# Patient Record
Sex: Male | Born: 1985 | Race: Black or African American | Hispanic: No | Marital: Single | Smoking: Current some day smoker
Health system: Southern US, Community
[De-identification: ages and names within clinical notes are randomized; demographics above are authoritative.]

## PROBLEM LIST (undated history)

## (undated) DIAGNOSIS — Z9229 Personal history of other drug therapy: Secondary | ICD-10-CM

## (undated) HISTORY — PX: HERNIA REPAIR: SHX51

---

## 2007-08-15 ENCOUNTER — Emergency Department (HOSPITAL_COMMUNITY): Admission: EM | Admit: 2007-08-15 | Discharge: 2007-08-15 | Payer: Self-pay | Admitting: Emergency Medicine

## 2007-12-28 ENCOUNTER — Emergency Department (HOSPITAL_COMMUNITY): Admission: EM | Admit: 2007-12-28 | Discharge: 2007-12-28 | Payer: Self-pay | Admitting: Emergency Medicine

## 2008-04-18 ENCOUNTER — Emergency Department (HOSPITAL_COMMUNITY): Admission: EM | Admit: 2008-04-18 | Discharge: 2008-04-19 | Payer: Self-pay | Admitting: Emergency Medicine

## 2011-08-01 LAB — DIFFERENTIAL
Basophils Relative: 1
Lymphocytes Relative: 48 — ABNORMAL HIGH
Monocytes Relative: 9
Neutro Abs: 2.1
Neutrophils Relative %: 39 — ABNORMAL LOW

## 2011-08-01 LAB — CBC
MCHC: 34.4
RBC: 4.86
WBC: 5.4

## 2011-08-01 LAB — POCT CARDIAC MARKERS
CKMB, poc: <1 — ABNORMAL LOW
Myoglobin, poc: 43
Troponin i, poc: <0.05

## 2011-08-01 LAB — B-NATRIURETIC PEPTIDE (CONVERTED LAB): Pro B Natriuretic peptide (BNP): <30

## 2011-08-07 LAB — DIFFERENTIAL
Eosinophils Absolute: 0.3
Eosinophils Relative: 3
Lymphs Abs: 3.5
Monocytes Relative: 7

## 2011-08-07 LAB — COMPREHENSIVE METABOLIC PANEL
ALT: 19
AST: 25
CO2: 25
Calcium: 9.6
GFR calc Af Amer: >60
Sodium: 140
Total Protein: 6.4

## 2011-08-07 LAB — CBC
MCHC: 34.7
RBC: 4.55
RDW: 12.7

## 2011-08-07 LAB — POCT CARDIAC MARKERS
CKMB, poc: <1 — ABNORMAL LOW
Myoglobin, poc: 46.7
Operator id: 277751
Troponin i, poc: <0.05

## 2011-11-11 ENCOUNTER — Encounter: Payer: Self-pay | Admitting: Emergency Medicine

## 2011-11-11 ENCOUNTER — Emergency Department (HOSPITAL_COMMUNITY)
Admission: EM | Admit: 2011-11-11 | Discharge: 2011-11-11 | Disposition: A | Discharge status: Home / Self Care | Payer: Self-pay | Attending: Emergency Medicine | Admitting: Emergency Medicine

## 2011-11-11 DIAGNOSIS — K2920 Alcoholic gastritis without bleeding: Secondary | ICD-10-CM | POA: Insufficient documentation

## 2011-11-11 DIAGNOSIS — K117 Disturbances of salivary secretion: Secondary | ICD-10-CM | POA: Insufficient documentation

## 2011-11-11 DIAGNOSIS — R1110 Vomiting, unspecified: Secondary | ICD-10-CM | POA: Insufficient documentation

## 2011-11-11 DIAGNOSIS — F17200 Nicotine dependence, unspecified, uncomplicated: Secondary | ICD-10-CM | POA: Insufficient documentation

## 2011-11-11 MED ORDER — ONDANSETRON HCL 8 MG PO TABS: 8.0000 mg | ORAL_TABLET | Freq: Once | ORAL | Status: DC

## 2011-11-11 MED ORDER — SODIUM CHLORIDE 0.9 % IV BOLUS (SEPSIS)
1000.0000 mL | Freq: Once | INTRAVENOUS | Status: AC
  Administered 2011-11-11: 1000 mL via INTRAVENOUS

## 2011-11-11 MED ORDER — ONDANSETRON HCL 4 MG/2ML IJ SOLN
4.0000 mg | Freq: Once | INTRAMUSCULAR | Status: AC
  Administered 2011-11-11: 4 mg via INTRAVENOUS
  Filled 2011-11-11: qty 2

## 2011-11-11 MED ORDER — ONDANSETRON 4 MG PO TBDP
ORAL_TABLET | ORAL | Status: AC
  Administered 2011-11-11: 8 mg via SUBLINGUAL
  Filled 2011-11-11: qty 2

## 2011-11-11 NOTE — ED Provider Notes (Signed)
History     CSN: [id number]  Arrival date & time 11/11/11  1615   First MD Initiated Contact with Patient 11/11/11 1830      Chief Complaint  Patient presents with  . Emesis    (Consider location/radiation/quality/duration/timing/severity/associated sxs/prior treatment) Patient is a 26 y.o. male presenting with vomiting. The history is provided by the patient.  Emesis  This is a new problem. The current episode started yesterday. The problem occurs more than 10 times per day. The problem has been gradually improving. The emesis has an appearance of stomach contents. There has been no fever. Pertinent negatives include no abdominal pain, no cough, no diarrhea, no fever and no URI. Risk factors: Heavy alcohol intake yesterday.    History reviewed. No pertinent past medical history.  History reviewed. No pertinent past surgical history.  History reviewed. No pertinent family history.  History  Substance Use Topics  . Smoking status: Current Some Day Smoker  . Smokeless tobacco: Not on file  . Alcohol Use: Yes      Review of Systems  Constitutional: Negative for fever.  Respiratory: Negative for cough.   Gastrointestinal: Positive for vomiting. Negative for abdominal pain and diarrhea.  All other systems reviewed and are negative.    Allergies  Review of patient's allergies indicates no known allergies.  Home Medications  No current outpatient prescriptions on file.  BP 133/88  Pulse 80  Temp 98.3 F (36.8 C)  Resp 18  SpO2 98%  Physical Exam  Nursing note and vitals reviewed. Constitutional: He is oriented to person, place, and time. He appears well-developed and well-nourished. No distress.  HENT:  Head: Normocephalic and atraumatic.  Mouth/Throat: Mucous membranes are dry.  Eyes: Conjunctivae and EOM are normal. Pupils are equal, round, and reactive to light.  Neck: Normal range of motion. Neck supple.  Cardiovascular: Normal rate, regular rhythm and  intact distal pulses.   No murmur heard. Pulmonary/Chest: Effort normal and breath sounds normal. No respiratory distress. He has no wheezes. He has no rales.  Abdominal: Soft. He exhibits no distension. There is no tenderness. There is no rebound and no guarding.  Musculoskeletal: Normal range of motion. He exhibits no edema and no tenderness.  Neurological: He is alert and oriented to person, place, and time.  Skin: Skin is warm and dry. No rash noted. No erythema.  Psychiatric: He has a normal mood and affect. His behavior is normal.    ED Course  Procedures (including critical care time)  Labs Reviewed - No data to display No results found.   No diagnosis found.    MDM   Patient with persistent vomiting and nausea after heavy alcohol intake yesterday. No abdominal pain normal exam and after ODT Zofran and feeling much better. Will fluid challenge.  7:39 PM Patient given sprite and started to feel nauseated again. Will give IV hydration and antibiotic      Gwyneth Sprout, MD 11/11/11 2007

## 2011-11-11 NOTE — ED Provider Notes (Signed)
I initially evaluated the pt.  The PA continued treatment and I have reviewed the note and agree with management.  Gwyneth Sprout, MD 11/11/11 2257

## 2011-11-11 NOTE — ED Provider Notes (Signed)
History     CSN: [id number]  Arrival date & time 11/11/11  1615   First MD Initiated Contact with Patient 11/11/11 1830      Chief Complaint  Patient presents with  . Emesis    (Consider location/radiation/quality/duration/timing/severity/associated sxs/prior treatment) HPI  History reviewed. No pertinent past medical history.  History reviewed. No pertinent past surgical history.  History reviewed. No pertinent family history.  History  Substance Use Topics  . Smoking status: Current Some Day Smoker  . Smokeless tobacco: Not on file  . Alcohol Use: Yes      Review of Systems  Allergies  Review of patient's allergies indicates no known allergies.  Home Medications  No current outpatient prescriptions on file.  BP 115/66  Pulse 92  Temp(Src) 98.3 F (36.8 C) (Oral)  Resp 18  SpO2 99%  Physical Exam  ED Course  Procedures (including critical care time)  Labs Reviewed - No data to display No results found.   1. Alcoholic gastritis    9:57 PM handoff from Dr. Anitra Lauth. Patient is feeling better after fluids. Will discharge.   MDM          Carolee Rota, PA 11/11/11 2157

## 2011-11-11 NOTE — ED Notes (Signed)
Pt drinking sprite- po challenge.

## 2011-11-11 NOTE — ED Notes (Signed)
Pt here with vomiting that started this morning after going out last night . Pt states that she is very thirsty

## 2012-05-03 ENCOUNTER — Emergency Department (HOSPITAL_COMMUNITY)
Admission: EM | Admit: 2012-05-03 | Discharge: 2012-05-03 | Disposition: A | Discharge status: Home / Self Care | Payer: Self-pay | Attending: Emergency Medicine | Admitting: Emergency Medicine

## 2012-05-03 ENCOUNTER — Encounter (HOSPITAL_COMMUNITY): Payer: Self-pay | Admitting: *Deleted

## 2012-05-03 DIAGNOSIS — K644 Residual hemorrhoidal skin tags: Secondary | ICD-10-CM | POA: Insufficient documentation

## 2012-05-03 DIAGNOSIS — F17200 Nicotine dependence, unspecified, uncomplicated: Secondary | ICD-10-CM | POA: Insufficient documentation

## 2012-05-03 DIAGNOSIS — K5900 Constipation, unspecified: Secondary | ICD-10-CM

## 2012-05-03 HISTORY — DX: Personal history of other drug therapy: Z92.29

## 2012-05-03 LAB — OCCULT BLOOD, POC DEVICE: Fecal Occult Bld: POSITIVE

## 2012-05-03 MED ORDER — MAGNESIUM CITRATE PO SOLN
1.0000 | Freq: Once | ORAL | Status: AC
  Administered 2012-05-03: 1 via ORAL
  Filled 2012-05-03: qty 296

## 2012-05-03 MED ORDER — DOCUSATE SODIUM 100 MG PO CAPS
100.0000 mg | ORAL_CAPSULE | Freq: Two times a day (BID) | ORAL | Status: DC
Start: 2012-05-03 — End: 2012-05-04

## 2012-05-03 MED ORDER — BISACODYL 10 MG RE SUPP
10.0000 mg | Freq: Once | RECTAL | Status: AC
  Administered 2012-05-03: 10 mg via RECTAL
  Filled 2012-05-03: qty 1

## 2012-05-03 MED ORDER — LIDOCAINE HCL 2 % EX GEL
Freq: Once | CUTANEOUS | Status: AC
  Administered 2012-05-03: 11:00:00 via TOPICAL
  Filled 2012-05-03: qty 20

## 2012-05-03 MED ORDER — POLYETHYLENE GLYCOL 3350 17 GM/SCOOP PO POWD: 17.0000 g | Freq: Every day | ORAL | Status: AC

## 2012-05-03 NOTE — ED Provider Notes (Signed)
History     CSN: [id number]  Arrival date & time 05/03/12  1308   First MD Initiated Contact with Patient 05/03/12 0840      Chief Complaint  Patient presents with  . Constipation    (Consider location/radiation/quality/duration/timing/severity/associated sxs/prior treatment) HPI Comments: Patient reports that she has been constipated over the past week.  She began having pain with BM four days ago.  She has been taking Hydrocodone for pain.  She noticed yesterday that there was some blood when she wiped and had significant pain and straining with bowel movement.  No prior history of hemorrhoids.  She also began having diffuse abdominal cramping yesterday.  Patient is a male who has recently undergone a sex change operation and likes to be referred to as a male.    Patient is a 26 y.o. male presenting with constipation. The history is provided by the patient.  Constipation  The current episode started more than 1 week ago. The onset was gradual. The problem has been gradually worsening. The pain is moderate. The stool is described as hard. There was no prior successful therapy. Prior unsuccessful therapies include stool softeners. Associated symptoms include abdominal pain and rectal pain. Pertinent negatives include no fever, no diarrhea, no nausea, no vomiting and no hematuria.    Past Medical History  Diagnosis Date  . History of hormone therapy     History reviewed. No pertinent past surgical history.  History reviewed. No pertinent family history.  History  Substance Use Topics  . Smoking status: Current Some Day Smoker  . Smokeless tobacco: Not on file  . Alcohol Use: Yes      Review of Systems  Constitutional: Negative for fever and chills.  Gastrointestinal: Positive for abdominal pain, constipation, blood in stool and rectal pain. Negative for nausea, vomiting, diarrhea and abdominal distention.  Genitourinary: Negative for dysuria, urgency, frequency, hematuria,  flank pain, penile swelling, penile pain and testicular pain.  All other systems reviewed and are negative.    Allergies  Review of patient's allergies indicates no known allergies.  Home Medications   Current Outpatient Rx  Name Route Sig Dispense Refill  . HYDROCODONE-ACETAMINOPHEN 5-500 MG PO TABS Oral Take 2 tablets by mouth every 6 (six) hours as needed. pain      BP 136/97  Pulse 91  Temp 98.4 F (36.9 C) (Oral)  Resp 20  SpO2 99%  Physical Exam  Nursing note and vitals reviewed. Constitutional: He appears well-developed and well-nourished. No distress.  HENT:  Head: Normocephalic and atraumatic.  Mouth/Throat: Oropharynx is clear and moist.  Cardiovascular: Normal rate, regular rhythm and normal heart sounds.   Pulmonary/Chest: Effort normal and breath sounds normal.  Abdominal: Soft. Bowel sounds are normal. He exhibits no distension and no mass. There is no tenderness. There is no rebound and no guarding.  Genitourinary: Rectal exam shows external hemorrhoid and tenderness. Rectal exam shows no internal hemorrhoid, no fissure, no mass and anal tone normal. Guaiac positive stool.  Neurological: He is alert.  Skin: Skin is warm and dry. He is not diaphoretic.  Psychiatric: He has a normal mood and affect.    ED Course  Procedures (including critical care time)   Labs Reviewed  OCCULT BLOOD, POC DEVICE   No results found.   No diagnosis found.  10:40 AM Reassessed patient.  She reports that she has had a bowel movement.  Abdominal pain has improved, but she is now having increased rectal pain.  MDM  Patient presenting  with pain with bowel movements and constipation.  Patient found to have an external hemorrhoid.  Patient given magnesium citrate and had a bowel movement.  Patient instructed to use Miralax and Colace.  Patient also instructed to take Sitz baths.  Return precautions discussed.        Pascal Lux Rupert, PA-C 05/04/12 0936  Magnus Sinning, PA-C 05/04/12 0939  Magnus Sinning, PA-C 05/04/12 [phone]

## 2012-05-03 NOTE — ED Notes (Signed)
To ED for eval of lower abd pain and constipation. States he has been stool softeners without relief. Noticed blood with last bm. Last normal bm was 2 days ago.

## 2012-05-03 NOTE — Discharge Instructions (Signed)
High Fiber Diet A high fiber diet changes your normal diet to include more whole grains, legumes, fruits, and vegetables. Changes in the diet involve replacing refined carbohydrates with unrefined foods. The calorie level of the diet is essentially unchanged. The Dietary Reference Intake (recommended amount) for adult males is 38 g per day. For adult females, it is 25 g per day. Pregnant and lactating women should consume 28 g of fiber per day. Fiber is the intact part of a plant that is not broken down during digestion. Functional fiber is fiber that has been isolated from the plant to provide a beneficial effect in the body. PURPOSE  Increase stool bulk.   Ease and regulate bowel movements.   Lower cholesterol.  INDICATIONS THAT YOU NEED MORE FIBER  Constipation and hemorrhoids.   Uncomplicated diverticulosis (intestine condition) and irritable bowel syndrome.   Weight management.   As a protective measure against hardening of the arteries (atherosclerosis), diabetes, and cancer.  NOTE OF CAUTION If you have a digestive or bowel problem, ask your caregiver for advice before adding high fiber foods to your diet. Some of the following medical problems are such that a high fiber diet should not be used without consulting your caregiver:  Acute diverticulitis (intestine infection).   Partial small bowel obstructions.   Complicated diverticular disease involving bleeding, rupture (perforation), or abscess (boil, furuncle).   Presence of autonomic neuropathy (nerve damage) or gastric paresis (stomach cannot empty itself).  GUIDELINES FOR INCREASING FIBER  Start adding fiber to the diet slowly. A gradual increase of about 5 more grams (2 slices of whole-wheat bread, 2 servings of most fruits or vegetables, or 1 bowl of high fiber cereal) per day is best. Too rapid an increase in fiber may result in constipation, flatulence, and bloating.   Drink enough water and fluids to keep your urine  clear or pale yellow. Water, juice, or caffeine-free drinks are recommended. Not drinking enough fluid may cause constipation.   Eat a variety of high fiber foods rather than one type of fiber.   Try to increase your intake of fiber through using high fiber foods rather than fiber pills or supplements that contain small amounts of fiber.   The goal is to change the types of food eaten. Do not supplement your present diet with high fiber foods, but replace foods in your present diet.  INCLUDE A VARIETY OF FIBER SOURCES  Replace refined and processed grains with whole grains, canned fruits with fresh fruits, and incorporate other fiber sources. White rice, white breads, and most bakery goods contain little or no fiber.   Brown whole-grain rice, buckwheat oats, and many fruits and vegetables are all good sources of fiber. These include: broccoli, Brussels sprouts, cabbage, cauliflower, beets, sweet potatoes, white potatoes (skin on), carrots, tomatoes, eggplant, squash, berries, fresh fruits, and dried fruits.   Cereals appear to be the richest source of fiber. Cereal fiber is found in whole grains and bran. Bran is the fiber-rich outer coat of cereal grain, which is largely removed in refining. In whole-grain cereals, the bran remains. In breakfast cereals, the largest amount of fiber is found in those with "bran" in their names. The fiber content is sometimes indicated on the label.   You may need to include additional fruits and vegetables each day.   In baking, for 1 cup white flour, you may use the following substitutions:   1 cup whole-wheat flour minus 2 tbs.    cup white flour plus    cup whole-wheat flour.  Document Released: 10/27/2005 Document Revised: 10/16/2011 Document Reviewed: 09/04/2009 [employer] Patient Information 2012 [employer].   Hemorrhoids Hemorrhoids are enlarged (dilated) veins around the rectum. There are 2 types of hemorrhoids, and the type of hemorrhoid is  determined by its location. Internal hemorrhoids occur in the veins just inside the rectum.They are usually not painful, but they may bleed.However, they may poke through to the outside and become irritated and painful. External hemorrhoids involve the veins outside the anus and can be felt as a painful swelling or hard lump near the anus.They are often itchy and may crack and bleed. Sometimes clots will form in the veins. This makes them swollen and painful. These are called thrombosed hemorrhoids. CAUSES Causes of hemorrhoids include:  Pregnancy. This increases the pressure in the hemorrhoidal veins.   Constipation.   Straining to have a bowel movement.   Obesity.   Heavy lifting or other activity that caused you to strain.  TREATMENT Most of the time hemorrhoids improve in 1 to 2 weeks. However, if symptoms do not seem to be getting better or if you have a lot of rectal bleeding, your caregiver may perform a procedure to help make the hemorrhoids get smaller or remove them completely.Possible treatments include:  Rubber band ligation. A rubber band is placed at the base of the hemorrhoid to cut off the circulation.   Sclerotherapy. A chemical is injected to shrink the hemorrhoid.   Infrared light therapy. Tools are used to burn the hemorrhoid.   Hemorrhoidectomy. This is surgical removal of the hemorrhoid.  HOME CARE INSTRUCTIONS   Increase fiber in your diet. Ask your caregiver about using fiber supplements.   Drink enough water and fluids to keep your urine clear or pale yellow.   Exercise regularly.   Go to the bathroom when you have the urge to have a bowel movement. Do not wait.   Avoid straining to have bowel movements.   Keep the anal area dry and clean.   Only take over-the-counter or prescription medicines for pain, discomfort, or fever as directed by your caregiver.  If your hemorrhoids are thrombosed:  Take warm sitz baths for 20 to 30 minutes, 3 to 4 times  per day.   If the hemorrhoids are very tender and swollen, place ice packs on the area as tolerated. Using ice packs between sitz baths may be helpful. Fill a plastic bag with ice. Place a towel between the bag of ice and your skin.   Medicated creams and suppositories may be used or applied as directed.   Do not use a donut-shaped pillow or sit on the toilet for long periods. This increases blood pooling and pain.  SEEK MEDICAL CARE IF:   You have increasing pain and swelling that is not controlled with your medicine.   You have uncontrolled bleeding.   You have difficulty or you are unable to have a bowel movement.   You have pain or inflammation outside the area of the hemorrhoids.   You have chills or an oral temperature above 102 F (38.9 C).  MAKE SURE YOU:   Understand these instructions.   Will watch your condition.   Will get help right away if you are not doing well or get worse.  Document Released: 10/24/2000 Document Revised: 10/16/2011 Document Reviewed: 02/29/2008 [employer] Patient Information 2012 [employer].  

## 2012-05-04 ENCOUNTER — Encounter (HOSPITAL_COMMUNITY): Payer: Self-pay | Admitting: *Deleted

## 2012-05-04 DIAGNOSIS — F17200 Nicotine dependence, unspecified, uncomplicated: Secondary | ICD-10-CM | POA: Insufficient documentation

## 2012-05-04 DIAGNOSIS — R339 Retention of urine, unspecified: Secondary | ICD-10-CM | POA: Insufficient documentation

## 2012-05-04 DIAGNOSIS — N419 Inflammatory disease of prostate, unspecified: Secondary | ICD-10-CM | POA: Insufficient documentation

## 2012-05-04 NOTE — ED Notes (Signed)
C/o lower abdominal pain that radiates to testicles and legs. Pain began while straining to have a BM today. Seen here the other day for constipation. Pt states, "It feels like I have to pee now but I can't pee"  Pt states the last time he was able to urinate was 2 days ago, but urine comes out when he strains.

## 2012-05-04 NOTE — ED Notes (Signed)
Patient states back pain x 3 weeks,l apin radiating down left leg into knee from back, patient also states now he has been having intermittant coughs and that causes increased back pain

## 2012-05-05 ENCOUNTER — Emergency Department (HOSPITAL_COMMUNITY)
Admission: EM | Admit: 2012-05-05 | Discharge: 2012-05-05 | Disposition: A | Discharge status: Home / Self Care | Payer: Self-pay | Attending: Emergency Medicine | Admitting: Emergency Medicine

## 2012-05-05 DIAGNOSIS — R339 Retention of urine, unspecified: Secondary | ICD-10-CM

## 2012-05-05 DIAGNOSIS — N419 Inflammatory disease of prostate, unspecified: Secondary | ICD-10-CM

## 2012-05-05 LAB — CBC
MCH: 31.9 pg (ref 26.0–34.0)
Platelets: 196 10*3/uL (ref 150–400)
RBC: 4.45 MIL/uL (ref 4.22–5.81)
WBC: 8.8 10*3/uL (ref 4.0–10.5)

## 2012-05-05 LAB — POCT I-STAT, CHEM 8
BUN: 10 mg/dL (ref 6–23)
Calcium, Ion: 1.13 mmol/L (ref 1.12–1.32)
Creatinine, Ser: 1.4 mg/dL — ABNORMAL HIGH (ref 0.50–1.35)
Glucose, Bld: 115 mg/dL — ABNORMAL HIGH (ref 70–99)
TCO2: 26 mmol/L (ref 0–100)

## 2012-05-05 LAB — URINE MICROSCOPIC-ADD ON

## 2012-05-05 LAB — DIFFERENTIAL
Basophils Absolute: 0 10*3/uL (ref 0.0–0.1)
Eosinophils Absolute: 0.1 10*3/uL (ref 0.0–0.7)
Lymphs Abs: 2.7 10*3/uL (ref 0.7–4.0)
Neutrophils Relative %: 54 % (ref 43–77)

## 2012-05-05 LAB — URINALYSIS, ROUTINE W REFLEX MICROSCOPIC
Nitrite: NEGATIVE
Specific Gravity, Urine: 1.027 (ref 1.005–1.030)
Urobilinogen, UA: 1 mg/dL (ref 0.0–1.0)

## 2012-05-05 MED ORDER — CIPROFLOXACIN HCL 500 MG PO TABS: 500.0000 mg | ORAL_TABLET | Freq: Two times a day (BID) | ORAL | Status: DC

## 2012-05-05 NOTE — ED Notes (Signed)
Foley cath inserted using sterile technique. Clear urine returned. Pt tolerated well 

## 2012-05-05 NOTE — ED Provider Notes (Signed)
History     CSN: [id number]  Arrival date & time 05/04/12  2247   First MD Initiated Contact with Patient 05/05/12 0130      Chief Complaint  Patient presents with  . Abdominal Pain    (Consider location/radiation/quality/duration/timing/severity/associated sxs/prior treatment) Patient is a 26 y.o. male presenting with abdominal pain. The history is provided by the patient.  Abdominal Pain The primary symptoms of the illness include abdominal pain. The primary symptoms of the illness do not include shortness of breath, nausea, vomiting or diarrhea.  Symptoms associated with the illness do not include back pain.   patient presents with lower abdominal pain and recent rectal pain. She was seen recently for constipation. Patient states that has resolved. She states it feels as if he has to urinate frequently. No fevers. He states the pain radiates to his testicles and down his legs. No fevers. No cough. She is transgendered. Past Medical History  Diagnosis Date  . History of hormone therapy     History reviewed. No pertinent past surgical history.  History reviewed. No pertinent family history.  History  Substance Use Topics  . Smoking status: Current Some Day Smoker  . Smokeless tobacco: Not on file  . Alcohol Use: Yes      Review of Systems  Constitutional: Negative for activity change and appetite change.  HENT: Negative for neck stiffness.   Eyes: Negative for pain.  Respiratory: Negative for chest tightness and shortness of breath.   Cardiovascular: Negative for chest pain and leg swelling.  Gastrointestinal: Positive for abdominal pain. Negative for nausea, vomiting and diarrhea.  Genitourinary: Positive for testicular pain. Negative for flank pain, discharge and penile swelling.  Musculoskeletal: Negative for back pain.  Skin: Negative for rash.  Neurological: Negative for weakness, numbness and headaches.  Psychiatric/Behavioral: Negative for behavioral problems.     Allergies  Review of patient's allergies indicates no known allergies.  Home Medications   Current Outpatient Rx  Name Route Sig Dispense Refill  . POLYETHYLENE GLYCOL 3350 PO POWD Oral Take 17 g by mouth daily. 255 g 0  . CIPROFLOXACIN HCL 500 MG PO TABS Oral Take 1 tablet (500 mg total) by mouth 2 (two) times daily. 28 tablet 0    BP 150/96  Pulse 80  Temp 98.4 F (36.9 C) (Oral)  Resp 16  SpO2 97%  Physical Exam  Nursing note and vitals reviewed. Constitutional: He is oriented to person, place, and time. He appears well-developed and well-nourished.  HENT:  Head: Normocephalic and atraumatic.  Eyes: EOM are normal. Pupils are equal, round, and reactive to light.  Neck: Normal range of motion. Neck supple.  Cardiovascular: Normal rate, regular rhythm and normal heart sounds.   No murmur heard. Pulmonary/Chest: Effort normal and breath sounds normal.  Abdominal: Soft. Bowel sounds are normal. He exhibits no distension and no mass. There is tenderness. There is no rebound and no guarding.       Suprapubic and left lower quadrant tenderness. No rebound or guarding. No hernias palpated.  Genitourinary: Penis normal. No penile tenderness.       No testicular tenderness or mass. Paraneal tenderness.  Musculoskeletal: Normal range of motion. He exhibits no edema.  Neurological: He is alert and oriented to person, place, and time. No cranial nerve deficit.  Skin: Skin is warm and dry.  Psychiatric: He has a normal mood and affect.    ED Course  Procedures (including critical care time)  Labs Reviewed  URINALYSIS, ROUTINE W  REFLEX MICROSCOPIC - Abnormal; Notable for the following:    Color, Urine AMBER (*)  BIOCHEMICALS MAY BE AFFECTED BY COLOR   APPearance CLOUDY (*)     Bilirubin Urine SMALL (*)     Ketones, ur 15 (*)     Protein, ur 100 (*)     Leukocytes, UA SMALL (*)     All other components within normal limits  DIFFERENTIAL - Abnormal; Notable for the  following:    Monocytes Relative 14 (*)     Monocytes Absolute 1.2 (*)     All other components within normal limits  POCT I-STAT, CHEM 8 - Abnormal; Notable for the following:    Creatinine, Ser 1.40 (*)     Glucose, Bld 115 (*)     All other components within normal limits  CBC  URINE MICROSCOPIC-ADD ON  URINE CULTURE   No results found.   1. Urinary retention   2. 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 His head lower abdominal pain and rectal pain. He's had the feeling that he has not been able urinate. Post with residual is greater than 300. Patient's symptoms were much improved with Foley catheter. His creatinine is increased to 1.4. Patient will be discharge with Foley catheter will be treated for prostatitis. He will follow with urology.        Juliet Rude. Rubin Payor, MD 05/05/12 [phone]

## 2012-05-05 NOTE — Discharge Instructions (Signed)
Prostatitis Prostatitis is an inflammation (the body's way of reacting to injury and/or infection) of the prostate gland. The prostate gland is a male organ. The gland is about the size and shape of a walnut. The prostate is located just below the bladder. It produces semen, which is a fluid that helps nourish and transport sperm. Prostatitis is the most common urinary tract problem in men younger than age 24. There are 4 categories of prostatitis:  I - Acute bacterial prostatitis.   II - Chronic bacterial prostatitis.   III - Chronic prostatitis and chronic pelvic pain syndrome (CPPS).   Inflammatory.   Non inflammatory.   IV - Asymptomatic inflammatory prostatitis.  Acute and chronic bacterial prostatitis are problems with bacterial infections of the prostate. "Acute" infection is usually a one-time problem. "Chronic" bacterial prostatitis is a condition with recurrent infection. It is usually caused by the same germ(bacteria). CPPS has symptoms similar to prostate infection. However, no infection is actually found. This condition can cause problems of ongoing pain. Currently, it cannot be cured. Treatments are available and aimed at symptom control.  Asymptomatic inflammatory prostatitis has no symptoms. It is a condition where infection-fighting cells are found by chance in the urine. The diagnosis is made most often during an exam for other conditions. Other conditions could be infertility or a high level of PSA (prostate-specific antigen) in the blood. SYMPTOMS  Symptoms can vary depending upon the type of prostatitis that exists. There can also be overlap in symptoms. This can make diagnosis difficult. Symptoms: For Acute bacterial prostatitis  Painful urination.   Fever or chills.   Muscle or joint pains.   Low back pain.   Low abdominal pain.   Inability to empty bladder completely.   Sudden urges to urinate.   Frequent urination during the day.   Difficulty starting  urine stream.   Need to urinate several times at night (nocturia).   Weak urine stream.   Urethral (tube that carries urine from the bladder out of the body) discharge and dribbling after urination.  For Chronic bacterial prostatitis  Rectal pain.   Pain in the testicles, penis, or tip of the penis.   Pain in the space between the anus and scrotum (perineum).   Low back pain.   Low abdominal pain.   Problems with sexual function.   Painful ejaculation.   Bloody semen.   Inability to empty bladder completely.   Painful urination.   Sudden urges to urinate.   Frequent urination during the day.   Difficulty starting urine stream.   Need to urinate several times at night (nocturia).   Weak urine stream.   Dribbling after urination.   Urethral discharge.  For Chronic prostatitis and chronic pelvic pain syndrome (CPPS) Symptoms are the same as those for chronic bacterial prostatitis. Problems with sexual function are often the reason for seeking care. This important problem should be discussed with your caregiver. For Asymptomatic inflammatory prostatitis As noted above, there are no symptoms with this condition. DIAGNOSIS   Your caregiver may perform a rectal exam. This exam is to determine if the prostate is swollen and tender.   Sometimes blood work is performed. This is done to see if your white blood cell count is elevated. The Prostate Specific Antigen (PSA) is also measured. PSA is a blood test that can help detect early prostate cancer.   A urinalysis is done to find out what type of infection is present if this is a suspected cause. An   additional urinalysis may be done after a digital rectal exam. This is to see if white blood cells are pushed out of the prostate and into the urine. A low-grade infection of the prostate may not be found on the first urinalysis.  In more difficult cases, your caregiver may advise other tests. Tests could include:  Urodynamics  -- Tests the function of the bladder and the organs involved in triggering and controlling normal urination.   Urine flow rate.   Cystoscopy -- In this procedure, a thin, telescope-like tube with a light and tiny camera attached (cystoscope) is inserted into the bladder through the urethra. This allows the caregiver to see the inside of the urethra and bladder.   Electromyography -- This procedure tests how the muscles and nerves of the bladder work. It is focused on the muscles that control the anus and pelvic floor. These are the muscles between the anus and scrotum.  In people who show no signs of infection, certain uncommon infections might be causing constant or recurrent symptoms. These uncommon infections are difficult to detect. More work in medicine may help find solutions to these problems. TREATMENT  Antibiotics are used to treat infections caused by germs. If the infection is not treated and becomes long lasting (chronic), it may become a lower grade infection with minor, continual problems. Without treatment, the prostate may develop a boil or furuncle (abscess). This may require surgical treatment. For those with chronic prostatitis and CPPS, it is important to work closely with your primary caregiver and urologist. For some, the medicines that are used to treat a non-cancerous, enlarged prostate (benign prostatic hypertrophy) may be helpful. Referrals to specialists other than urologists may be necessary. In rare cases when all treatments have been inadequate for pain control, an operation to remove the prostate may be recommended. This is very rare and before this is considered thorough discussion with your urologist is highly recommended.  In cases of secondary to chronic non-bacterial prostatitis, a good relationship with your urologist or primary caregiver is essential because it is often a recurrent prolonged condition that requires a good understanding of the causes and a commitment  to therapy aimed at controlling your symptoms. HOME CARE INSTRUCTIONS   Hot sitz baths for 20 minutes, 4 times per day, may help relieve pain.   Non-prescription pain killers may be used as your caregiver recommends if you have no allergies to them. Some illnesses or conditions prevent use of non-prescription drugs. If unsure, check with your caregiver. Take all medications as directed. Take the antibiotics for the prescribed length of time, even if you are feeling better.  SEEK MEDICAL CARE IF:   You have any worsening of the symptoms that originally brought you to your caregiver.   You have an oral temperature above 102 F (38.9 C).   You experience any side effects from medications prescribed.  SEEK IMMEDIATE MEDICAL CARE IF:   You have an oral temperature above 102 F (38.9 C), not controlled by medicine.   You have pain not relieved with medications.   You develop nausea, vomiting, lightheadedness, or have a fainting episode.   You are unable to urinate.   You pass bloody urine or clots.  Document Released: 10/24/2000 Document Revised: 10/16/2011 Document Reviewed: 09/29/2011 [facility] Patient Information 2012 [place], [place].Acute Urinary Retention, Male You have been seen by a caregiver today because of your inability to urinate (pass your water). This is a common problem in elderly males. As men age their prostates  become larger and block the flow of urine from the bladder. This is usually a problem that has come on gradually. It is often first noticed by having to get up at night to urinate. This is because as the prostate enlarges it is more difficult to empty the bladder completely. Treatment may involve a one time catheterization to empty the bladder. This is putting in a tube to drain your urine. Then you and your personal caregiver can decide at your earliest convenience how to handle this problem in the future. It may also be a problem that may not recur for years.  Sometimes this problem can be caused by medications. In this case, all that is often necessary is to discontinue the offending agent. If you are to leave the foley catheter (a long, narrow, hollow tube) in and go home with a drainage system, you will need to discuss the best course of action with your caregiver. While the catheter is in, maintain a good intake of fluids. Keep the drainage bag emptied and lower than your catheter. This is so contaminated (infected) urine will not be flowing back into your bladder. This could lead to a urinary tract infection. Only take over-the-counter or prescription medicines for pain, discomfort, or fever as directed by your caregiver.  SEEK IMMEDIATE MEDICAL CARE IF:  You develop chills, fever, or show signs of generalized illness that occurs prior to seeing your caregiver. Document Released: 02/02/2001 Document Revised: 10/16/2011 Document Reviewed: 10/18/2008 [facility] Patient Information 2012 [place], [place].

## 2012-05-05 NOTE — ED Notes (Signed)
Pt given urinal and told to call out once he had urinated so his bladder could be scanned for residual urine.

## 2012-05-06 LAB — URINE CULTURE
Colony Count: NO GROWTH
Culture  Setup Time: 201306260634
Culture: NO GROWTH

## 2012-05-10 NOTE — ED Provider Notes (Signed)
Medical screening examination/treatment/procedure(s) were performed by non-physician practitioner and as supervising physician I was immediately available for consultation/collaboration.  Raeford Razor, MD 05/10/12 [phone]

## 2012-05-12 ENCOUNTER — Encounter (HOSPITAL_COMMUNITY): Payer: Self-pay | Admitting: Neurology

## 2012-05-12 ENCOUNTER — Emergency Department (HOSPITAL_COMMUNITY)
Admission: EM | Admit: 2012-05-12 | Discharge: 2012-05-12 | Disposition: A | Discharge status: Home / Self Care | Payer: Self-pay | Attending: Emergency Medicine | Admitting: Emergency Medicine

## 2012-05-12 DIAGNOSIS — R300 Dysuria: Secondary | ICD-10-CM

## 2012-05-12 DIAGNOSIS — F17200 Nicotine dependence, unspecified, uncomplicated: Secondary | ICD-10-CM | POA: Insufficient documentation

## 2012-05-12 DIAGNOSIS — Z466 Encounter for fitting and adjustment of urinary device: Secondary | ICD-10-CM | POA: Insufficient documentation

## 2012-05-12 MED ORDER — ONDANSETRON 4 MG PO TBDP
8.0000 mg | ORAL_TABLET | Freq: Once | ORAL | Status: AC
  Administered 2012-05-12: 8 mg via ORAL

## 2012-05-12 MED ORDER — ONDANSETRON HCL 8 MG PO TABS: 4.0000 mg | ORAL_TABLET | Freq: Once | ORAL | Status: DC

## 2012-05-12 MED ORDER — ONDANSETRON HCL 4 MG PO TABS: 4.0000 mg | ORAL_TABLET | Freq: Four times a day (QID) | ORAL | Status: AC

## 2012-05-12 MED ORDER — CEFTRIAXONE SODIUM 250 MG IJ SOLR
250.0000 mg | Freq: Once | INTRAMUSCULAR | Status: AC
  Administered 2012-05-12: 250 mg via INTRAMUSCULAR
  Filled 2012-05-12: qty 250

## 2012-05-12 MED ORDER — ONDANSETRON 4 MG PO TBDP
ORAL_TABLET | ORAL | Status: AC
  Filled 2012-05-12: qty 2

## 2012-05-12 MED ORDER — AZITHROMYCIN 250 MG PO TABS
1000.0000 mg | ORAL_TABLET | Freq: Once | ORAL | Status: AC
  Administered 2012-05-12: 1000 mg via ORAL
  Filled 2012-05-12: qty 4

## 2012-05-12 NOTE — ED Notes (Signed)
Pt requesting to have urinary catheter removal which was placed on 6/26. Denying any problems. Pt reporting didn't follow up with urology. Now requesting to have catheter removal.

## 2012-05-12 NOTE — ED Provider Notes (Signed)
History     CSN: [id number]  Arrival date & time 05/12/12  0811   First MD Initiated Contact with Patient 05/12/12 [phone]      Chief Complaint  Patient presents with  . Urinary Catheter Removal     (Consider location/radiation/quality/duration/timing/severity/associated sxs/prior treatment) Patient is a 26 y.o. male presenting with dysuria. The history is provided by the patient.  Dysuria  Pertinent negatives include no nausea and no vomiting. Associated symptoms comments: He was seen and treated on 05-03-12 for difficulty urinating with foley catheter insertion and antibiotic treatment with Ciprofloxacin. He states he was instructed to return for catheter removal. He denies any persistent dysuria, fever, abdominal pain. He reports continuous urinary drainage into leg bag..    Past Medical History  Diagnosis Date  . History of hormone therapy     History reviewed. No pertinent past surgical history.  No family history on file.  History  Substance Use Topics  . Smoking status: Current Some Day Smoker  . Smokeless tobacco: Not on file  . Alcohol Use: Yes      Review of Systems  Constitutional: Negative for fever.  Gastrointestinal: Negative for nausea, vomiting and abdominal pain.  Genitourinary: Negative for dysuria, scrotal swelling and testicular pain.       See HPI.    Allergies  Review of patient's allergies indicates no known allergies.  Home Medications   Current Outpatient Rx  Name Route Sig Dispense Refill  . CIPROFLOXACIN HCL 500 MG PO TABS Oral Take 500 mg by mouth 2 (two) times daily. 28 day supply started on 05-06-12 for infection .    Marland Kitchen IBUPROFEN 200 MG PO TABS Oral Take 400 mg by mouth 2 (two) times daily as needed. For pain    . MOTRIN PM PO Oral Take 2 tablets by mouth at bedtime as needed. For sleep    . MAGNESIUM CITRATE 1.745 GM/30ML PO SOLN Oral Take 1 Bottle by mouth once.    Marland Kitchen POLYETHYLENE GLYCOL 3350 PO PACK Oral Take 17 g by mouth daily.    Marland Kitchen  PREPARATION H EX Apply externally Apply 1 application topically daily.    Weyman Croon HAZEL-GLYCERIN EX PADS Rectal Place 1 application rectally as needed. For rectal pain      BP 159/90  Pulse 81  Temp 97.4 F (36.3 C) (Oral)  Resp 18  SpO2 100%  Physical Exam  Constitutional: He is oriented to person, place, and time. He appears well-developed and well-nourished.  Abdominal: Soft. There is no tenderness.  Genitourinary:       Foley catheter in place without drainage, bleeding or discharge around meatus. No testicular tenderness or scrotal swelling.   Neurological: He is alert and oriented to person, place, and time.  Skin: Skin is warm and dry.  Psychiatric: He has a normal mood and affect.    ED Course  Procedures (including critical care time)  Labs Reviewed - No data to display No results found. Results for orders placed during the hospital encounter of 05/05/12  URINALYSIS, ROUTINE W REFLEX MICROSCOPIC      Component Value Range   Color, Urine AMBER (*) YELLOW   APPearance CLOUDY (*) CLEAR   Specific Gravity, Urine 1.027  1.005 - 1.030   pH 6.0  5.0 - 8.0   Glucose, UA NEGATIVE  NEGATIVE mg/dL   Hgb urine dipstick NEGATIVE  NEGATIVE   Bilirubin Urine SMALL (*) NEGATIVE   Ketones, ur 15 (*) NEGATIVE mg/dL   Protein, ur 562 (*)  NEGATIVE mg/dL   Urobilinogen, UA 1.0  0.0 - 1.0 mg/dL   Nitrite NEGATIVE  NEGATIVE   Leukocytes, UA SMALL (*) NEGATIVE  CBC      Component Value Range   WBC 8.8  4.0 - 10.5 K/uL   RBC 4.45  4.22 - 5.81 MIL/uL   Hemoglobin 14.2  13.0 - 17.0 g/dL   HCT 16.1  09.6 - 04.5 %   MCV 90.1  78.0 - 100.0 fL   MCH 31.9  26.0 - 34.0 pg   MCHC 35.4  30.0 - 36.0 g/dL   RDW 40.9  81.1 - 91.4 %   Platelets 196  150 - 400 K/uL  DIFFERENTIAL      Component Value Range   Neutrophils Relative 54  43 - 77 %   Neutro Abs 4.8  1.7 - 7.7 K/uL   Lymphocytes Relative 30  12 - 46 %   Lymphs Abs 2.7  0.7 - 4.0 K/uL   Monocytes Relative 14 (*) 3 - 12 %    Monocytes Absolute 1.2 (*) 0.1 - 1.0 K/uL   Eosinophils Relative 1  0 - 5 %   Eosinophils Absolute 0.1  0.0 - 0.7 K/uL   Basophils Relative 1  0 - 1 %   Basophils Absolute 0.0  0.0 - 0.1 K/uL  URINE MICROSCOPIC-ADD ON      Component Value Range   Squamous Epithelial / LPF RARE  RARE   WBC, UA 3-6  <3 WBC/hpf   RBC / HPF 0-2  <3 RBC/hpf   Bacteria, UA RARE  RARE   Urine-Other MUCOUS PRESENT    POCT I-STAT, CHEM 8      Component Value Range   Sodium 136  135 - 145 mEq/L   Potassium 3.7  3.5 - 5.1 mEq/L   Chloride 98  96 - 112 mEq/L   BUN 10  6 - 23 mg/dL   Creatinine, Ser 7.82 (*) 0.50 - 1.35 mg/dL   Glucose, Bld 956 (*) 70 - 99 mg/dL   Calcium, Ion 2.13  0.86 - 1.32 mmol/L   TCO2 26  0 - 100 mmol/L   Hemoglobin 14.3  13.0 - 17.0 g/dL   HCT 57.8  46.9 - 62.9 %  URINE CULTURE      Component Value Range   Specimen Description URINE, RANDOM     Special Requests CX ADDED AT 0621     Culture  Setup Time [id number]     Colony Count NO GROWTH     Culture NO GROWTH     Report Status 05/06/2012 FINAL       No diagnosis found.  1. Dysuria 2. Foley catheter removal.  MDM  Foley removed. He has urinated without difficulty in the department. He was treated with Cipro earlier, but urine culture reviewed and showed no growth. Consider STD source of prostatitis in young otherwise healthy male. No cultures done on previous visit - patient given Azithromycin and Rocephin. Will discharge home.         Rodena Medin, PA-C 05/12/12 1236

## 2012-05-12 NOTE — ED Notes (Signed)
Foley catheter removed by NT

## 2012-05-12 NOTE — ED Provider Notes (Signed)
Medical screening examination/treatment/procedure(s) were performed by non-physician practitioner and as supervising physician I was immediately available for consultation/collaboration.  Undine Nealis, MD 05/12/12 1531 

## 2012-05-12 NOTE — ED Notes (Signed)
Pt reporting feeling nauseated. Requesting something for nausea. PA in to see patient

## 2013-10-09 ENCOUNTER — Encounter (HOSPITAL_COMMUNITY): Payer: Self-pay | Admitting: Emergency Medicine

## 2013-10-09 ENCOUNTER — Emergency Department (HOSPITAL_COMMUNITY)
Admission: EM | Admit: 2013-10-09 | Discharge: 2013-10-09 | Disposition: A | Discharge status: Home / Self Care | Payer: Self-pay | Attending: Emergency Medicine | Admitting: Emergency Medicine

## 2013-10-09 DIAGNOSIS — R509 Fever, unspecified: Secondary | ICD-10-CM | POA: Insufficient documentation

## 2013-10-09 DIAGNOSIS — F17200 Nicotine dependence, unspecified, uncomplicated: Secondary | ICD-10-CM | POA: Insufficient documentation

## 2013-10-09 DIAGNOSIS — J029 Acute pharyngitis, unspecified: Secondary | ICD-10-CM | POA: Insufficient documentation

## 2013-10-09 LAB — RAPID STREP SCREEN (MED CTR MEBANE ONLY): Streptococcus, Group A Screen (Direct): POSITIVE — AB

## 2013-10-09 MED ORDER — AZITHROMYCIN 250 MG PO TABS: 250.0000 mg | ORAL_TABLET | Freq: Every day | ORAL | Status: DC

## 2013-10-09 MED ORDER — PREDNISONE 20 MG PO TABS
60.0000 mg | ORAL_TABLET | Freq: Once | ORAL | Status: AC
  Administered 2013-10-09: 60 mg via ORAL
  Filled 2013-10-09: qty 3

## 2013-10-09 NOTE — ED Notes (Signed)
Reports having sore throat and chills this am. Airway intact.

## 2013-10-09 NOTE — ED Provider Notes (Signed)
CSN: [id number]     Arrival date & time 10/09/13  0711 History   First MD Initiated Contact with Patient 10/09/13 [phone]     Chief Complaint  Patient presents with  . Sore Throat    Patient is a 27 y.o. male presenting with pharyngitis. The history is provided by the patient.  Sore Throat This is a new problem. The current episode started 2 days ago. The problem occurs daily. The problem has been gradually worsening. The symptoms are aggravated by swallowing. Nothing relieves the symptoms. He has tried water for the symptoms. The treatment provided no relief.    Past Medical History  Diagnosis Date  . History of hormone therapy    History reviewed. No pertinent past surgical history. History reviewed. No pertinent family history. History  Substance Use Topics  . Smoking status: Current Some Day Smoker  . Smokeless tobacco: Not on file  . Alcohol Use: Yes    Review of Systems  Constitutional: Positive for fever and chills.  Respiratory: Negative for cough.   Skin: Negative for rash.    Allergies  Review of patient's allergies indicates no known allergies.  Home Medications   Current Outpatient Rx  Name  Route  Sig  Dispense  Refill  . Pseudoeph-Doxylamine-DM-APAP (NYQUIL PO)   Oral   Take 1-2 tablets by mouth every 6 (six) hours as needed (cold symptoms).         . Pseudoephedrine-APAP-DM (DAYQUIL PO)   Oral   Take 1-2 tablets by mouth every 6 (six) hours as needed (cold symptoms).         Marland Kitchen azithromycin (ZITHROMAX) 250 MG tablet   Oral   Take 1 tablet (250 mg total) by mouth daily. Take first 2 tablets together, then 1 every day until finished.   6 tablet   0    BP 151/88  Pulse 91  Temp(Src) 99.2 F (37.3 C) (Oral)  Resp 16  Ht 6' (1.829 m)  SpO2 100% Physical Exam CONSTITUTIONAL: Well developed/well nourished HEAD AND FACE: Normocephalic/atraumatic EYES: EOMI/PERRL ENMT: Mucous membranes moist.  Uvula midline.  Tonsils enlarged.  Diffuse pharyngeal  erythema without exudates noted, no stridor, handling secretions well NECK: supple no meningeal signs CV: S1/S2 noted, no murmurs/rubs/gallops noted LUNGS: Lungs are clear to auscultation bilaterally, no apparent distress ABDOMEN: soft, nontender, no rebound or guarding NEURO: Pt is awake/alert, moves all extremitiesx4 EXTREMITIES:full ROM SKIN: warm, color normal  ED Course  Procedures (including critical care time) Labs Review Labs Reviewed  RAPID STREP SCREEN - Abnormal; Notable for the following:    Streptococcus, Group A Screen (Direct) POSITIVE (*)    All other components within normal limits   Imaging Review No results found.  EKG Interpretation   None       MDM   1. Pharyngitis    Nursing notes including past medical history and social history reviewed and considered in documentation Labs/vital reviewed and considered     Joya Gaskins, MD 10/09/13 [phone]

## 2016-02-24 ENCOUNTER — Emergency Department (HOSPITAL_COMMUNITY): Payer: Self-pay

## 2016-02-24 ENCOUNTER — Encounter (HOSPITAL_COMMUNITY): Payer: Self-pay | Admitting: *Deleted

## 2016-02-24 ENCOUNTER — Emergency Department (HOSPITAL_COMMUNITY)
Admission: EM | Admit: 2016-02-24 | Discharge: 2016-02-24 | Disposition: A | Discharge status: Home / Self Care | Payer: Self-pay | Attending: Emergency Medicine | Admitting: Emergency Medicine

## 2016-02-24 DIAGNOSIS — F17200 Nicotine dependence, unspecified, uncomplicated: Secondary | ICD-10-CM | POA: Insufficient documentation

## 2016-02-24 DIAGNOSIS — Y9339 Activity, other involving climbing, rappelling and jumping off: Secondary | ICD-10-CM | POA: Insufficient documentation

## 2016-02-24 DIAGNOSIS — W19XXXA Unspecified fall, initial encounter: Secondary | ICD-10-CM

## 2016-02-24 DIAGNOSIS — W1789XA Other fall from one level to another, initial encounter: Secondary | ICD-10-CM | POA: Insufficient documentation

## 2016-02-24 DIAGNOSIS — Y9289 Other specified places as the place of occurrence of the external cause: Secondary | ICD-10-CM | POA: Insufficient documentation

## 2016-02-24 DIAGNOSIS — S50812A Abrasion of left forearm, initial encounter: Secondary | ICD-10-CM | POA: Insufficient documentation

## 2016-02-24 DIAGNOSIS — S79912A Unspecified injury of left hip, initial encounter: Secondary | ICD-10-CM | POA: Insufficient documentation

## 2016-02-24 DIAGNOSIS — Z23 Encounter for immunization: Secondary | ICD-10-CM | POA: Insufficient documentation

## 2016-02-24 DIAGNOSIS — S50811A Abrasion of right forearm, initial encounter: Secondary | ICD-10-CM | POA: Insufficient documentation

## 2016-02-24 DIAGNOSIS — Y998 Other external cause status: Secondary | ICD-10-CM | POA: Insufficient documentation

## 2016-02-24 MED ORDER — HYDROCODONE-ACETAMINOPHEN 5-325 MG PO TABS: 1.0000 | ORAL_TABLET | Freq: Four times a day (QID) | ORAL | Status: DC | PRN

## 2016-02-24 MED ORDER — TETANUS-DIPHTH-ACELL PERTUSSIS 5-2.5-18.5 LF-MCG/0.5 IM SUSP
0.5000 mL | Freq: Once | INTRAMUSCULAR | Status: AC
  Administered 2016-02-24: 0.5 mL via INTRAMUSCULAR
  Filled 2016-02-24: qty 0.5

## 2016-02-24 MED ORDER — HYDROCODONE-ACETAMINOPHEN 5-325 MG PO TABS
1.0000 | ORAL_TABLET | Freq: Once | ORAL | Status: AC
  Administered 2016-02-24: 1 via ORAL
  Filled 2016-02-24: qty 1

## 2016-02-24 NOTE — ED Notes (Signed)
Discharge instructions reviewed - voiced understanding 
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
## 2016-02-24 NOTE — Discharge Instructions (Signed)
Abrasion An abrasion is a cut or scrape on the surface of your skin. An abrasion does not go through all of the layers of your skin. It is important to take good care of your abrasion to prevent infection. HOME CARE Medicines  Take or apply medicines only as told by your doctor.  If you were prescribed an antibiotic ointment, finish all of it even if you start to feel better. Wound Care  Clean the wound with mild soap and water 2-3 times per day or as told by your doctor. Pat your wound dry with a clean towel. Do not rub it.  There are many ways to close and cover a wound. Follow instructions from your doctor about:  How to take care of your wound.  When and how you should change your bandage (dressing).  When and how you should take off your dressing.  Check your wound every day for signs of infection. Watch for:  Redness, swelling, or pain.  Fluid, blood, or pus. General Instructions  Keep the dressing dry as told by your doctor. Do not take baths, swim, use a hot tub, or do anything that would put your wound underwater until your doctor says it is okay.  If there is swelling, raise (elevate) the injured area above the level of your heart while you are sitting or lying down.  Keep all follow-up visits as told by your doctor. This is important. GET HELP IF:  You were given a tetanus shot and you have any of these where the needle went in:  Swelling.  Very bad pain.  Redness.  Bleeding.  Medicine does not help your pain.  You have any of these at the site of the wound:  More redness.  More swelling.  More pain. GET HELP RIGHT AWAY IF:  You have a red streak going away from your wound.  You have a fever.  You have fluid, blood, or pus coming from your wound.  There is a bad smell coming from your wound.   This information is not intended to replace advice given to you by your health care provider. Make sure you discuss any questions you have with your  health care provider.   Document Released: 04/14/2008 Document Revised: 03/13/2015 Document Reviewed: 10/25/2014 Elsevier Interactive Patient Education 2016 Else[employer] Inc[employer]  Wound Care Taking care of your wound properly can help to prevent pain and infection. It can also help your wound to heal more quickly.  HOW TO CARE FOR YOUR WOUND  Take or apply over-the-counter and prescription medicines only as told by your health care provider.  If you were prescribed antibiotic medicine, take or apply it as told by your health care provider. Do not stop using the antibiotic even if your condition improves.  Clean the wound each day or as told by your health care provider.  Wash the wound with mild soap and water.  Rinse the wound with water to remove all soap.  Pat the wound dry with a clean towel. Do not rub it.  There are many different ways to close and cover a wound. For example, a wound can be covered with stitches (sutures), skin glue, or adhesive strips. Follow instructions from your health care provider about:  How to take care of your wound.  When and how you should change your bandage (dressing).  When you should remove your dressing.  Removing whatever was used to close your wound.  Check your wound every day for signs of infection.  Watch for:  Redness, swelling, or pain.  Fluid, blood, or pus.  Keep the dressing dry until your health care provider says it can be removed. Do not take baths, swim, use a hot tub, or do anything that would put your wound underwater until your health care provider approves.  Raise (elevate) the injured area above the level of your heart while you are sitting or lying down.  Do not scratch or pick at the wound.  Keep all follow-up visits as told by your health care provider. This is important. SEEK MEDICAL CARE IF:  You received a tetanus shot and you have swelling, severe pain, redness, or bleeding at the injection site.  You have a  fever.  Your pain is not controlled with medicine.  You have increased redness, swelling, or pain at the site of your wound.  You have fluid, blood, or pus coming from your wound.  You notice a bad smell coming from your wound or your dressing. SEEK IMMEDIATE MEDICAL CARE IF:  You have a red streak going away from your wound.   This information is not intended to replace advice given to you by your health care provider. Make sure you discuss any questions you have with your health care provider.   Document Released: 08/05/2008 Document Revised: 03/13/2015 Document Reviewed: 10/23/2014 Elsevier Interactive Patient Education [employer] Elsevier Inc[employer]
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
## 2016-02-25 NOTE — ED Notes (Signed)
Discharge instructions and prescription reviewed - voiced understanding.  

## 2016-02-25 NOTE — ED Notes (Signed)
Bilateral forearms irrigated with NS, bacitracin applied and dressing on.  Instructed patient and Mother how to do dressings voiced understanding

## 2018-04-14 ENCOUNTER — Ambulatory Visit: Payer: Self-pay | Admitting: Physician Assistant

## 2019-05-24 ENCOUNTER — Other Ambulatory Visit: Payer: Self-pay

## 2019-05-24 ENCOUNTER — Ambulatory Visit (HOSPITAL_COMMUNITY)
Admission: EM | Admit: 2019-05-24 | Discharge: 2019-05-24 | Disposition: A | Discharge status: Home / Self Care | Payer: Self-pay | Attending: Emergency Medicine | Admitting: Emergency Medicine

## 2019-05-24 DIAGNOSIS — K0889 Other specified disorders of teeth and supporting structures: Secondary | ICD-10-CM

## 2019-05-24 MED ORDER — HYDROCODONE-ACETAMINOPHEN 5-325 MG PO TABS: 2.0000 | ORAL_TABLET | ORAL | 0 refills | Status: AC | PRN

## 2019-05-24 MED ORDER — AMOXICILLIN 500 MG PO CAPS: 500.0000 mg | ORAL_CAPSULE | Freq: Three times a day (TID) | ORAL | 0 refills | Status: AC

## 2019-05-24 NOTE — ED Provider Notes (Signed)
[place]    CSN: [id number] Arrival date & time: 05/24/19  1642     History   Chief Complaint Chief Complaint  Patient presents with  . Dental Pain    HPI Brett Simmons is a 33 y.o. male.   Patient presents with right lower tooth pain x1 week.  States tooth broken when biting on a lollipop.  Denies fever, chills, ear pain, sore throat, difficulty swallowing, difficulty breathing, abdominal pain, vomiting, diarrhea.    The history is provided by the patient.    Past Medical History:  Diagnosis Date  . History of hormone therapy     There are no active problems to display for this patient.   Past Surgical History:  Procedure Laterality Date  . HERNIA REPAIR         Home Medications    Prior to Admission medications   Medication Sig Start Date End Date Taking? Authorizing Provider  amoxicillin (AMOXIL) 500 MG capsule Take 1 capsule (500 mg total) by mouth 3 (three) times daily. 05/24/19   Sharion Balloon, NP  HYDROcodone-acetaminophen (NORCO/VICODIN) 5-325 MG tablet Take 2 tablets by mouth every 4 (four) hours as needed. 05/24/19   Sharion Balloon, NP  Pseudoeph-Doxylamine-DM-APAP (NYQUIL PO) Take 1-2 tablets by mouth every 6 (six) hours as needed (cold symptoms).    [provider]  Pseudoephedrine-APAP-DM (DAYQUIL PO) Take 1-2 tablets by mouth every 6 (six) hours as needed (cold symptoms).    [provider]    Family History No family history on file.  Social History Social History   Tobacco Use  . Smoking status: Current Some Day Smoker  . Smokeless tobacco: Never Used  Substance Use Topics  . Alcohol use: Yes  . Drug use: No     Allergies   Patient has no known allergies.   Review of Systems Review of Systems  Constitutional: Negative for chills and fever.  HENT: Positive for dental problem. Negative for ear pain, sore throat and trouble swallowing.   Eyes: Negative for pain and visual disturbance.  Respiratory:  Negative for cough and shortness of breath.   Cardiovascular: Negative for chest pain and palpitations.  Gastrointestinal: Negative for abdominal pain and vomiting.  Genitourinary: Negative for dysuria and hematuria.  Musculoskeletal: Negative for arthralgias and back pain.  Skin: Negative for color change and rash.  Neurological: Negative for seizures and syncope.  All other systems reviewed and are negative.    Physical Exam Triage Vital Signs ED Triage Vitals [05/24/19 1659]  Enc Vitals Group     BP      Pulse      Resp      Temp      Temp src      SpO2      Weight      Height      Head Circumference      Peak Flow      Pain Score 10     Pain Loc      Pain Edu?      Excl. in [place]?    No data found.  Updated Vital Signs There were no vitals taken for this visit.  Visual Acuity Right Eye Distance:   Left Eye Distance:   Bilateral Distance:    Right Eye Near:   Left Eye Near:    Bilateral Near:     Physical Exam Vitals signs and nursing note reviewed.  Constitutional:      Appearance: He is well-developed.  HENT:     Head: Normocephalic and atraumatic.     Right Ear: Tympanic membrane normal.     Left Ear: Tympanic membrane normal.     Mouth/Throat:     Mouth: Mucous membranes are moist.     Dentition: Dental caries present.     Pharynx: Oropharynx is clear. Uvula midline. No uvula swelling.      Comments: Right lower molar broken.  Eyes:     Conjunctiva/sclera: Conjunctivae normal.  Neck:     Musculoskeletal: Neck supple.  Cardiovascular:     Rate and Rhythm: Normal rate and regular rhythm.  Pulmonary:     Effort: Pulmonary effort is normal. No respiratory distress.     Breath sounds: Normal breath sounds.  Abdominal:     Palpations: Abdomen is soft.     Tenderness: There is no abdominal tenderness.  Skin:    General: Skin is warm and dry.  Neurological:     Mental Status: He is alert.      UC Treatments / Results  Labs (all labs ordered  are listed, but only abnormal results are displayed) Labs Reviewed - No data to display  EKG   Radiology No results found.  Procedures Procedures (including critical care time)  Medications Ordered in UC Medications - No data to display  Initial Impression / Assessment and Plan / UC Course  I have reviewed the triage vital signs and the nursing notes.  Pertinent labs & imaging results that were available during my care of the patient were reviewed by me and considered in my medical decision making (see chart for details).   Toothache.  Treating today with amoxicillin, over-the-counter Motrin, and Norco as needed.  Prescribe 6 tablets of Norco; instructions given to patient not to drive, operate machinery, drink alcohol while taking this medication.  Instructed patient to follow-up with a dentist as soon as possible; dental resource guide given.  Discussed with patient to return here if develops fever, chills, ear pain, sore throat, vomiting, diarrhea, or other concerning symptoms.  Discussed to go to the emergency department if develops difficulty swallowing or breathing.    Final Clinical Impressions(s) / UC Diagnoses   Final diagnoses:  Toothache     Discharge Instructions     Take the amoxicillin as prescribed.  Take ibuprofen as needed for pain.  Take Norco as needed for severe pain; do not drive, operate machinery, drink alcohol while taking this medication as it may make you drowsy.   Follow-up with a dentist as soon as possible; see the attached dental resource guide.   Return here if you develop fever, chills, earache, sore throat, abdominal pain, vomiting, diarrhea.     Go to the emergency department if you develop difficulty swallowing or breathing.        ED Prescriptions    Medication Sig Dispense Auth. Provider   amoxicillin (AMOXIL) 500 MG capsule Take 1 capsule (500 mg total) by mouth 3 (three) times daily. 21 capsule Wendee Beaversate, Skyah Hannon H, NP    HYDROcodone-acetaminophen (NORCO/VICODIN) 5-325 MG tablet Take 2 tablets by mouth every 4 (four) hours as needed. 6 tablet Mickie Bailate, Annelise Mccoy H, NP     Controlled Substance Prescriptions [place] Controlled Substance Registry consulted? Yes, I have consulted the [place] Controlled Substances Registry for this patient, and feel the risk/benefit ratio today is favorable for proceeding with this prescription for a controlled substance.   Mickie Bailate, Kyliana Standen H, NP 05/24/19 1742

## 2019-05-24 NOTE — Discharge Instructions (Addendum)
Take the amoxicillin as prescribed.  Take ibuprofen as needed for pain.  Take Norco as needed for severe pain; do not drive, operate machinery, drink alcohol while taking this medication as it may make you drowsy.   Follow-up with a dentist as soon as possible; see the attached dental resource guide.   Return here if you develop fever, chills, earache, sore throat, abdominal pain, vomiting, diarrhea.     Go to the emergency department if you develop difficulty swallowing or breathing.

## 2019-05-24 NOTE — ED Triage Notes (Signed)
Per pt he started having a bad tooth ache on the bottom right side. Pt has broken teeth on the bottom. Pt says hard to chew and eat.

## 2019-12-23 ENCOUNTER — Emergency Department (HOSPITAL_COMMUNITY): Payer: No Typology Code available for payment source

## 2019-12-23 ENCOUNTER — Encounter (HOSPITAL_COMMUNITY): Payer: Self-pay

## 2019-12-23 ENCOUNTER — Other Ambulatory Visit: Payer: Self-pay

## 2019-12-23 ENCOUNTER — Emergency Department (HOSPITAL_COMMUNITY)
Admission: EM | Admit: 2019-12-23 | Discharge: 2019-12-23 | Disposition: A | Discharge status: Home / Self Care | Payer: No Typology Code available for payment source | Attending: Emergency Medicine | Admitting: Emergency Medicine

## 2019-12-23 DIAGNOSIS — Y92410 Unspecified street and highway as the place of occurrence of the external cause: Secondary | ICD-10-CM | POA: Insufficient documentation

## 2019-12-23 DIAGNOSIS — Y999 Unspecified external cause status: Secondary | ICD-10-CM | POA: Insufficient documentation

## 2019-12-23 DIAGNOSIS — Y939 Activity, unspecified: Secondary | ICD-10-CM | POA: Insufficient documentation

## 2019-12-23 DIAGNOSIS — F17200 Nicotine dependence, unspecified, uncomplicated: Secondary | ICD-10-CM | POA: Insufficient documentation

## 2019-12-23 DIAGNOSIS — R220 Localized swelling, mass and lump, head: Secondary | ICD-10-CM | POA: Diagnosis not present

## 2019-12-23 DIAGNOSIS — M25512 Pain in left shoulder: Secondary | ICD-10-CM | POA: Diagnosis not present

## 2019-12-23 DIAGNOSIS — M542 Cervicalgia: Secondary | ICD-10-CM | POA: Diagnosis present

## 2019-12-23 DIAGNOSIS — R519 Headache, unspecified: Secondary | ICD-10-CM | POA: Insufficient documentation

## 2019-12-23 LAB — CBC
HCT: 41.9 % (ref 39.0–52.0)
Hemoglobin: 14.2 g/dL (ref 13.0–17.0)
MCH: 31.1 pg (ref 26.0–34.0)
MCHC: 33.9 g/dL (ref 30.0–36.0)
MCV: 91.7 fL (ref 80.0–100.0)
Platelets: 246 10*3/uL (ref 150–400)
RBC: 4.57 MIL/uL (ref 4.22–5.81)
RDW: 12.1 % (ref 11.5–15.5)
WBC: 6.7 10*3/uL (ref 4.0–10.5)
nRBC: 0 % (ref 0.0–0.2)

## 2019-12-23 LAB — COMPREHENSIVE METABOLIC PANEL
ALT: 30 U/L (ref 0–44)
AST: 25 U/L (ref 15–41)
Albumin: 3.7 g/dL (ref 3.5–5.0)
Alkaline Phosphatase: 64 U/L (ref 38–126)
Anion gap: 4 — ABNORMAL LOW (ref 5–15)
BUN: 10 mg/dL (ref 6–20)
CO2: 25 mmol/L (ref 22–32)
Calcium: 9 mg/dL (ref 8.9–10.3)
Chloride: 111 mmol/L (ref 98–111)
Creatinine, Ser: 1.31 mg/dL — ABNORMAL HIGH (ref 0.61–1.24)
GFR calc Af Amer: >60 mL/min (ref >60)
GFR calc non Af Amer: >60 mL/min (ref >60)
Glucose, Bld: 104 mg/dL — ABNORMAL HIGH (ref 70–99)
Potassium: 3.8 mmol/L (ref 3.5–5.1)
Sodium: 140 mmol/L (ref 135–145)
Total Bilirubin: 0.6 mg/dL (ref 0.3–1.2)
Total Protein: 7.1 g/dL (ref 6.5–8.1)

## 2019-12-23 LAB — I-STAT CREATININE, ED: Creatinine, Ser: 1.3 mg/dL — ABNORMAL HIGH (ref 0.61–1.24)

## 2019-12-23 MED ORDER — HYDROCODONE-ACETAMINOPHEN 5-325 MG PO TABS
1.0000 | ORAL_TABLET | Freq: Once | ORAL | Status: AC
  Administered 2019-12-23: 1 via ORAL
  Filled 2019-12-23: qty 1

## 2019-12-23 MED ORDER — NAPROXEN 500 MG PO TABS: 500.0000 mg | ORAL_TABLET | Freq: Two times a day (BID) | ORAL | 0 refills | Status: AC

## 2019-12-23 MED ORDER — IOHEXOL 300 MG/ML  SOLN
100.0000 mL | Freq: Once | INTRAMUSCULAR | Status: AC | PRN
  Administered 2019-12-23: 100 mL via INTRAVENOUS

## 2019-12-23 MED ORDER — METHOCARBAMOL 500 MG PO TABS: 500.0000 mg | ORAL_TABLET | Freq: Every evening | ORAL | 0 refills | Status: AC | PRN
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
## 2019-12-23 NOTE — ED Notes (Signed)
Pt dc'd home w/all belongings, a/o x4 
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
## 2019-12-23 NOTE — Discharge Instructions (Signed)
Take naproxen 2 times a day with meals.  Do not take other anti-inflammatories at the same time (Advil, Motrin, ibuprofen, Aleve). You may supplement with Tylenol if you need further pain control. °Use robaxin as needed for muscle stiffness or soreness.  Have caution, this may make you tired or groggy.  Do not drive or operate heavy machinery while taking this medicine. °Use ice packs or heating pads if this helps control your pain. °You will likely have continued muscle stiffness and soreness over the next couple days.  Follow-up with primary care in 1 week if your symptoms are not improving. °Return to the emergency room if you develop vision changes, vomiting, slurred speech, numbness, loss of bowel or bladder control, or any new or worsening symptoms. ° °

## 2020-06-21 ENCOUNTER — Encounter (HOSPITAL_COMMUNITY): Payer: Self-pay | Admitting: Emergency Medicine

## 2020-06-21 DIAGNOSIS — R600 Localized edema: Secondary | ICD-10-CM | POA: Insufficient documentation

## 2020-06-21 DIAGNOSIS — H539 Unspecified visual disturbance: Secondary | ICD-10-CM | POA: Insufficient documentation

## 2020-06-21 DIAGNOSIS — F17200 Nicotine dependence, unspecified, uncomplicated: Secondary | ICD-10-CM | POA: Insufficient documentation

## 2020-06-21 LAB — COMPREHENSIVE METABOLIC PANEL
ALT: 23 U/L (ref 0–44)
AST: 20 U/L (ref 15–41)
Albumin: 3.5 g/dL (ref 3.5–5.0)
Alkaline Phosphatase: 90 U/L (ref 38–126)
Anion gap: 7 (ref 5–15)
BUN: 10 mg/dL (ref 6–20)
CO2: 29 mmol/L (ref 22–32)
Calcium: 9.1 mg/dL (ref 8.9–10.3)
Chloride: 102 mmol/L (ref 98–111)
Creatinine, Ser: 1.18 mg/dL (ref 0.61–1.24)
GFR calc Af Amer: >60 mL/min (ref >60)
GFR calc non Af Amer: >60 mL/min (ref >60)
Glucose, Bld: 115 mg/dL — ABNORMAL HIGH (ref 70–99)
Potassium: 3.7 mmol/L (ref 3.5–5.1)
Sodium: 138 mmol/L (ref 135–145)
Total Bilirubin: 0.2 mg/dL — ABNORMAL LOW (ref 0.3–1.2)
Total Protein: 8.1 g/dL (ref 6.5–8.1)

## 2020-06-21 LAB — CBC
HCT: 40.4 % (ref 39.0–52.0)
Hemoglobin: 13.1 g/dL (ref 13.0–17.0)
MCH: 29.9 pg (ref 26.0–34.0)
MCHC: 32.4 g/dL (ref 30.0–36.0)
MCV: 92.2 fL (ref 80.0–100.0)
Platelets: 295 10*3/uL (ref 150–400)
RBC: 4.38 MIL/uL (ref 4.22–5.81)
RDW: 12.6 % (ref 11.5–15.5)
WBC: 5.2 10*3/uL (ref 4.0–10.5)
nRBC: 0 % (ref 0.0–0.2)

## 2020-06-21 LAB — LIPASE, BLOOD: Lipase: 30 U/L (ref 11–51)

## 2020-06-21 NOTE — ED Triage Notes (Signed)
Pt reports c/o foot and ankle swelling that started 2 weeks ago. Reports that it has gotten worse over night. Pt also reports having vision changes including bright flashes.

## 2020-06-22 ENCOUNTER — Emergency Department (HOSPITAL_COMMUNITY)
Admission: EM | Admit: 2020-06-22 | Discharge: 2020-06-22 | Disposition: A | Discharge status: Home / Self Care | Payer: Self-pay | Attending: Emergency Medicine | Admitting: Emergency Medicine

## 2020-06-22 DIAGNOSIS — R609 Edema, unspecified: Secondary | ICD-10-CM

## 2020-06-22 MED ORDER — FUROSEMIDE 20 MG PO TABS: 20.0000 mg | ORAL_TABLET | Freq: Every day | ORAL | 0 refills | Status: AC

## 2020-06-22 NOTE — Discharge Instructions (Signed)
Take Lasix as prescribed for leg swelling. Return to the emergency department if you develop any chest pain, shortness of breath or if swelling becomes significantly worse.   Follow up with a primary care provider of your choice (resources provided) for further outpatient evaluation and management of swelling. Follow up with Dr. Allena Katz for full eye exam.

## 2020-06-22 NOTE — ED Provider Notes (Signed)
[facility]-EMERGENCY DEPT Provider Note   CSN: [id number] Arrival date & time: 06/21/20  1242     History Chief Complaint  Patient presents with  . Foot Swelling    Brett Simmons is a 34 y.o. male.  Patient to ED for evaluation of bilateral LE edema. Symptoms started 3 months ago and have been progressive, worsening in the last 2 weeks. No SOB, chest pain, cough, nausea, vomiting. No erythema or fever. He reports the legs are sore and painful. Started working at [employer] 3 months ago, but reports that prior positions also required him to be on his feet during shifts. He also reports intermittent flashes of light in his peripheral vision bilaterally that can happen at any time. No other visual disturbance. No eye pain or headache.  The history is provided by the patient. No language interpreter was used.       Past Medical History:  Diagnosis Date  . History of hormone therapy     There are no problems to display for this patient.   Past Surgical History:  Procedure Laterality Date  . HERNIA REPAIR         History reviewed. No pertinent family history.  Social History   Tobacco Use  . Smoking status: Current Some Day Smoker  . Smokeless tobacco: Never Used  Substance Use Topics  . Alcohol use: Yes  . Drug use: No    Home Medications Prior to Admission medications   Medication Sig Start Date End Date Taking? Authorizing Provider  amoxicillin (AMOXIL) 500 MG capsule Take 1 capsule (500 mg total) by mouth 3 (three) times daily. 05/24/19   Mickie Bail, NP  HYDROcodone-acetaminophen (NORCO/VICODIN) 5-325 MG tablet Take 2 tablets by mouth every 4 (four) hours as needed. 05/24/19   Mickie Bail, NP  methocarbamol (ROBAXIN) 500 MG tablet Take 1 tablet (500 mg total) by mouth at bedtime as needed for muscle spasms. 12/23/19   Caccavale, Sophia, PA-C  naproxen (NAPROSYN) 500 MG tablet Take 1 tablet (500 mg total) by mouth 2 (two) times daily with  a meal. 12/23/19   Caccavale, Sophia, PA-C  Pseudoeph-Doxylamine-DM-APAP (NYQUIL PO) Take 1-2 tablets by mouth every 6 (six) hours as needed (cold symptoms).    [provider]  Pseudoephedrine-APAP-DM (DAYQUIL PO) Take 1-2 tablets by mouth every 6 (six) hours as needed (cold symptoms).    [provider]    Allergies    Patient has no known allergies.  Review of Systems   Review of Systems  Constitutional: Negative for chills and fever.  Eyes: Positive for visual disturbance.  Respiratory: Negative.  Negative for shortness of breath.   Cardiovascular: Positive for leg swelling. Negative for chest pain.  Gastrointestinal: Negative.  Negative for abdominal pain and nausea.  Musculoskeletal: Negative.   Skin: Negative.   Neurological: Negative.     Physical Exam Updated Vital Signs BP (!) 145/98 (BP Location: Left Arm)   Pulse (!) 52   Temp 98 F (36.7 C) (Oral)   Resp 16   Ht 6' (1.829 m)   Wt 102.1 kg   SpO2 100%   BMI 30.52 kg/m   Physical Exam Vitals and nursing note reviewed.  Constitutional:      Appearance: Normal appearance. He is well-developed.  HENT:     Head: Normocephalic.  Eyes:     Extraocular Movements: Extraocular movements intact.     Conjunctiva/sclera: Conjunctivae normal.     Pupils: Pupils are equal, round, and reactive  to light.     Comments: No loss in any visual field.  Cardiovascular:     Rate and Rhythm: Normal rate and regular rhythm.     Heart sounds: No murmur heard.   Pulmonary:     Effort: Pulmonary effort is normal.     Breath sounds: Normal breath sounds. No wheezing, rhonchi or rales.  Abdominal:     General: Bowel sounds are normal.     Palpations: Abdomen is soft.     Tenderness: There is no abdominal tenderness. There is no guarding or rebound.  Musculoskeletal:        General: Tenderness present. Normal range of motion.     Cervical back: Normal range of motion and neck supple.     Right lower leg: Edema  present.     Left lower leg: Edema present.     Comments: Bilateral LE pitting edema, with pitting to proximal lower legs. No redness or warmth. No skin breakdown.   Skin:    General: Skin is warm and dry.     Findings: No rash.  Neurological:     Mental Status: He is alert and oriented to person, place, and time.     ED Results / Procedures / Treatments   Labs (all labs ordered are listed, but only abnormal results are displayed) Labs Reviewed  COMPREHENSIVE METABOLIC PANEL - Abnormal; Notable for the following components:      Result Value   Glucose, Bld 115 (*)    Total Bilirubin 0.2 (*)    All other components within normal limits  LIPASE, BLOOD  CBC  URINALYSIS, ROUTINE W REFLEX MICROSCOPIC   Results for orders placed or performed during the hospital encounter of 06/22/20  Lipase, blood  Result Value Ref Range   Lipase 30 11 - 51 U/L  Comprehensive metabolic panel  Result Value Ref Range   Sodium 138 135 - 145 mmol/L   Potassium 3.7 3.5 - 5.1 mmol/L   Chloride 102 98 - 111 mmol/L   CO2 29 22 - 32 mmol/L   Glucose, Bld 115 (H) 70 - 99 mg/dL   BUN 10 6 - 20 mg/dL   Creatinine, Ser 3.79 0.61 - 1.24 mg/dL   Calcium 9.1 8.9 - 02.4 mg/dL   Total Protein 8.1 6.5 - 8.1 g/dL   Albumin 3.5 3.5 - 5.0 g/dL   AST 20 15 - 41 U/L   ALT 23 0 - 44 U/L   Alkaline Phosphatase 90 38 - 126 U/L   Total Bilirubin 0.2 (L) 0.3 - 1.2 mg/dL   GFR calc non Af Amer >60 >60 mL/min   GFR calc Af Amer >60 >60 mL/min   Anion gap 7 5 - 15  CBC  Result Value Ref Range   WBC 5.2 4.0 - 10.5 K/uL   RBC 4.38 4.22 - 5.81 MIL/uL   Hemoglobin 13.1 13.0 - 17.0 g/dL   HCT 09.7 39 - 52 %   MCV 92.2 80.0 - 100.0 fL   MCH 29.9 26.0 - 34.0 pg   MCHC 32.4 30.0 - 36.0 g/dL   RDW 35.3 29.9 - 24.2 %   Platelets 295 150 - 400 K/uL   nRBC 0.0 0.0 - 0.2 %    EKG None  Radiology No results found.  Procedures Procedures (including critical care time)  Medications Ordered in ED Medications - No  data to display  ED Course  I have reviewed the triage vital signs and the nursing notes.  Pertinent labs &  imaging results that were available during my care of the patient were reviewed by me and considered in my medical decision making (see chart for details).    MDM Rules/Calculators/A&P                          Patient to ED for evaluation LE swelling x 3 months, worse in the last 2 weeks.   No SOB or chest pain. No evidence CHF. No electrolyte abnorms. Mild hypertension at 145/98 not felt contributory.  Patient will be started on low dose Lasix and is strongly encouraged to get established with PCP for further outpatient evaluation. Will provide referrals for PCP as well as optho for full eye exam.   Final Clinical Impression(s) / ED Diagnoses Final diagnoses:  None   1. LE edema 2. Visual disturbance  Rx / DC Orders ED Discharge Orders    None       Danne Harbor 06/22/20 0410    Glynn Octave, MD 06/22/20 [phone]
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
## 2024-04-13 ENCOUNTER — Emergency Department (HOSPITAL_COMMUNITY)
Admission: EM | Admit: 2024-04-13 | Discharge: 2024-04-14 | Disposition: A | Discharge status: Home / Self Care | Payer: Self-pay | Attending: Emergency Medicine | Admitting: Emergency Medicine

## 2024-04-13 ENCOUNTER — Other Ambulatory Visit: Payer: Self-pay

## 2024-04-13 DIAGNOSIS — Z202 Contact with and (suspected) exposure to infections with a predominantly sexual mode of transmission: Secondary | ICD-10-CM | POA: Insufficient documentation

## 2024-04-13 DIAGNOSIS — M2550 Pain in unspecified joint: Secondary | ICD-10-CM | POA: Insufficient documentation

## 2024-04-13 NOTE — ED Triage Notes (Signed)
 PT complains of generalized body x 3 weeks. Pt states last intercourse was 1  year 7 months ago and contacted that partner and was told partner has syphilis. Pt concerned that body aches are from having an STD

## 2024-04-14 LAB — RPR
RPR Ser Ql: REACTIVE — AB
RPR Titer: 1:32 {titer}

## 2024-04-14 LAB — HIV ANTIBODY (ROUTINE TESTING W REFLEX): HIV Screen 4th Generation wRfx: NONREACTIVE

## 2024-04-14 NOTE — Discharge Instructions (Signed)
 We will call and notify you if you have a positive syphilis test.  Otherwise, I recommend that you follow-up at the clinic listed above.
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
## 2024-04-15 LAB — T.PALLIDUM AB, TOTAL: T Pallidum Abs: REACTIVE — AB

## 2024-04-20 ENCOUNTER — Ambulatory Visit (HOSPITAL_COMMUNITY): Payer: Self-pay
